# Patient Record
Sex: Female | Born: 1965 | Race: Black or African American | Hispanic: No | Smoking: Never smoker
Health system: Southern US, Community
[De-identification: ages and names within clinical notes are randomized; demographics above are authoritative.]

## PROBLEM LIST (undated history)

## (undated) DIAGNOSIS — M069 Rheumatoid arthritis, unspecified: Secondary | ICD-10-CM

## (undated) DIAGNOSIS — I890 Lymphedema, not elsewhere classified: Secondary | ICD-10-CM

---

## 2020-05-29 DIAGNOSIS — M797 Fibromyalgia: Secondary | ICD-10-CM | POA: Diagnosis not present

## 2020-06-26 DIAGNOSIS — Z23 Encounter for immunization: Secondary | ICD-10-CM | POA: Diagnosis not present

## 2020-10-16 DIAGNOSIS — M069 Rheumatoid arthritis, unspecified: Secondary | ICD-10-CM | POA: Diagnosis not present

## 2020-10-16 DIAGNOSIS — Z13220 Encounter for screening for lipoid disorders: Secondary | ICD-10-CM | POA: Diagnosis not present

## 2020-10-16 DIAGNOSIS — I10 Essential (primary) hypertension: Secondary | ICD-10-CM | POA: Diagnosis not present

## 2020-10-16 DIAGNOSIS — G43109 Migraine with aura, not intractable, without status migrainosus: Secondary | ICD-10-CM | POA: Diagnosis not present

## 2020-10-16 DIAGNOSIS — Z1159 Encounter for screening for other viral diseases: Secondary | ICD-10-CM | POA: Diagnosis not present

## 2020-11-13 DIAGNOSIS — D72829 Elevated white blood cell count, unspecified: Secondary | ICD-10-CM | POA: Diagnosis not present

## 2020-11-13 DIAGNOSIS — I10 Essential (primary) hypertension: Secondary | ICD-10-CM | POA: Diagnosis not present

## 2020-11-18 ENCOUNTER — Other Ambulatory Visit: Payer: Self-pay | Admitting: Family Medicine

## 2020-11-18 DIAGNOSIS — Z1231 Encounter for screening mammogram for malignant neoplasm of breast: Secondary | ICD-10-CM

## 2021-01-08 ENCOUNTER — Other Ambulatory Visit: Payer: Self-pay

## 2021-01-08 ENCOUNTER — Ambulatory Visit
Admission: RE | Admit: 2021-01-08 | Discharge: 2021-01-08 | Disposition: A | Discharge status: Home / Self Care | Payer: BC Managed Care – PPO | Source: Ambulatory Visit | Attending: Family Medicine | Admitting: Family Medicine

## 2021-01-08 DIAGNOSIS — Z1231 Encounter for screening mammogram for malignant neoplasm of breast: Secondary | ICD-10-CM | POA: Diagnosis not present

## 2021-02-05 DIAGNOSIS — I10 Essential (primary) hypertension: Secondary | ICD-10-CM | POA: Diagnosis not present

## 2021-02-05 DIAGNOSIS — M069 Rheumatoid arthritis, unspecified: Secondary | ICD-10-CM | POA: Diagnosis not present

## 2021-02-05 DIAGNOSIS — Z0000 Encounter for general adult medical examination without abnormal findings: Secondary | ICD-10-CM | POA: Diagnosis not present

## 2021-12-28 ENCOUNTER — Other Ambulatory Visit: Payer: Self-pay | Admitting: Family Medicine

## 2021-12-28 DIAGNOSIS — Z1231 Encounter for screening mammogram for malignant neoplasm of breast: Secondary | ICD-10-CM

## 2022-01-11 ENCOUNTER — Ambulatory Visit
Admission: RE | Admit: 2022-01-11 | Discharge: 2022-01-11 | Disposition: A | Discharge status: Home / Self Care | Payer: Managed Care, Other (non HMO) | Source: Ambulatory Visit | Attending: Family Medicine | Admitting: Family Medicine

## 2022-01-11 DIAGNOSIS — Z1231 Encounter for screening mammogram for malignant neoplasm of breast: Secondary | ICD-10-CM

## 2022-08-08 ENCOUNTER — Other Ambulatory Visit: Payer: Self-pay | Admitting: Family Medicine

## 2022-08-08 DIAGNOSIS — N644 Mastodynia: Secondary | ICD-10-CM

## 2022-08-18 ENCOUNTER — Ambulatory Visit
Admission: RE | Admit: 2022-08-18 | Discharge: 2022-08-18 | Disposition: A | Discharge status: Home / Self Care | Payer: Managed Care, Other (non HMO) | Source: Ambulatory Visit | Attending: Family Medicine | Admitting: Family Medicine

## 2022-08-18 DIAGNOSIS — N644 Mastodynia: Secondary | ICD-10-CM

## 2023-02-24 ENCOUNTER — Emergency Department (HOSPITAL_BASED_OUTPATIENT_CLINIC_OR_DEPARTMENT_OTHER): Payer: Managed Care, Other (non HMO)

## 2023-02-24 ENCOUNTER — Encounter (HOSPITAL_BASED_OUTPATIENT_CLINIC_OR_DEPARTMENT_OTHER): Payer: Self-pay | Admitting: Emergency Medicine

## 2023-02-24 ENCOUNTER — Other Ambulatory Visit: Payer: Self-pay

## 2023-02-24 ENCOUNTER — Emergency Department (HOSPITAL_BASED_OUTPATIENT_CLINIC_OR_DEPARTMENT_OTHER)
Admission: EM | Admit: 2023-02-24 | Discharge: 2023-02-24 | Disposition: A | Discharge status: Home / Self Care | Payer: Managed Care, Other (non HMO) | Attending: Emergency Medicine | Admitting: Emergency Medicine

## 2023-02-24 DIAGNOSIS — E876 Hypokalemia: Secondary | ICD-10-CM | POA: Diagnosis not present

## 2023-02-24 DIAGNOSIS — N3090 Cystitis, unspecified without hematuria: Secondary | ICD-10-CM | POA: Diagnosis not present

## 2023-02-24 DIAGNOSIS — R109 Unspecified abdominal pain: Secondary | ICD-10-CM | POA: Diagnosis present

## 2023-02-24 HISTORY — DX: Lymphedema, not elsewhere classified: I89.0

## 2023-02-24 HISTORY — DX: Rheumatoid arthritis, unspecified: M06.9

## 2023-02-24 LAB — URINALYSIS, ROUTINE W REFLEX MICROSCOPIC
Bilirubin Urine: NEGATIVE
Glucose, UA: NEGATIVE mg/dL
Ketones, ur: NEGATIVE mg/dL
Nitrite: POSITIVE — AB
Protein, ur: 30 mg/dL — AB
RBC / HPF: >50 RBC/hpf (ref 0–5)
Specific Gravity, Urine: 1.018 (ref 1.005–1.030)
WBC, UA: >50 WBC/hpf (ref 0–5)
pH: 6.5 (ref 5.0–8.0)

## 2023-02-24 LAB — CBC
HCT: 38.6 % (ref 36.0–46.0)
Hemoglobin: 13 g/dL (ref 12.0–15.0)
MCH: 27.5 pg (ref 26.0–34.0)
MCHC: 33.7 g/dL (ref 30.0–36.0)
MCV: 81.8 fL (ref 80.0–100.0)
Platelets: 306 10*3/uL (ref 150–400)
RBC: 4.72 MIL/uL (ref 3.87–5.11)
RDW: 15.2 % (ref 11.5–15.5)
WBC: 8.6 10*3/uL (ref 4.0–10.5)
nRBC: 0 % (ref 0.0–0.2)

## 2023-02-24 LAB — LIPASE, BLOOD: Lipase: 100 U/L — ABNORMAL HIGH (ref 11–51)

## 2023-02-24 LAB — COMPREHENSIVE METABOLIC PANEL
ALT: 28 U/L (ref 0–44)
AST: 23 U/L (ref 15–41)
Albumin: 4.4 g/dL (ref 3.5–5.0)
Alkaline Phosphatase: 52 U/L (ref 38–126)
Anion gap: 11 (ref 5–15)
BUN: 13 mg/dL (ref 6–20)
CO2: 28 mmol/L (ref 22–32)
Calcium: 10.1 mg/dL (ref 8.9–10.3)
Chloride: 101 mmol/L (ref 98–111)
Creatinine, Ser: 0.62 mg/dL (ref 0.44–1.00)
GFR, Estimated: >60 mL/min (ref >60)
Glucose, Bld: 130 mg/dL — ABNORMAL HIGH (ref 70–99)
Potassium: 3 mmol/L — ABNORMAL LOW (ref 3.5–5.1)
Sodium: 140 mmol/L (ref 135–145)
Total Bilirubin: 0.6 mg/dL (ref 0.3–1.2)
Total Protein: 7.4 g/dL (ref 6.5–8.1)

## 2023-02-24 LAB — PREGNANCY, URINE: Preg Test, Ur: POSITIVE — AB

## 2023-02-24 LAB — HCG, QUANTITATIVE, PREGNANCY: hCG, Beta Chain, Quant, S: 10 m[IU]/mL — ABNORMAL HIGH (ref <5)

## 2023-02-24 MED ORDER — ONDANSETRON 4 MG PO TBDP
4.0000 mg | ORAL_TABLET | Freq: Once | ORAL | Status: AC
  Administered 2023-02-24: 4 mg via ORAL
  Filled 2023-02-24: qty 1

## 2023-02-24 MED ORDER — POTASSIUM CHLORIDE 10 MEQ/100ML IV SOLN
10.0000 meq | Freq: Once | INTRAVENOUS | Status: AC
  Administered 2023-02-24: 10 meq via INTRAVENOUS
  Filled 2023-02-24: qty 100

## 2023-02-24 MED ORDER — CEPHALEXIN 500 MG PO CAPS: 500.0000 mg | ORAL_CAPSULE | Freq: Two times a day (BID) | ORAL | 0 refills | Status: AC

## 2023-02-24 MED ORDER — IOHEXOL 300 MG/ML  SOLN
100.0000 mL | Freq: Once | INTRAMUSCULAR | Status: AC | PRN
  Administered 2023-02-24: 80 mL via INTRAVENOUS

## 2023-02-24 MED ORDER — POTASSIUM CHLORIDE ER 10 MEQ PO TBCR: 10.0000 meq | EXTENDED_RELEASE_TABLET | Freq: Every day | ORAL | 0 refills | Status: AC

## 2023-02-24 MED ORDER — HYDROMORPHONE HCL 1 MG/ML IJ SOLN
1.0000 mg | Freq: Once | INTRAMUSCULAR | Status: AC
  Administered 2023-02-24: 1 mg via INTRAVENOUS
  Filled 2023-02-24: qty 1

## 2023-02-24 MED ORDER — ONDANSETRON HCL 4 MG PO TABS: 4.0000 mg | ORAL_TABLET | Freq: Four times a day (QID) | ORAL | 0 refills | Status: AC

## 2023-02-24 MED ORDER — SODIUM CHLORIDE 0.9 % IV SOLN
1.0000 g | Freq: Once | INTRAVENOUS | Status: AC
  Administered 2023-02-24: 1 g via INTRAVENOUS
  Filled 2023-02-24: qty 10

## 2023-02-24 NOTE — ED Provider Notes (Signed)
[place] EMERGENCY DEPARTMENT AT [facility] Provider Note   CSN: [id number] Arrival date & time: 02/24/23  1358     History  Chief Complaint  Patient presents with   Abdominal Pain    Jessica Parker is a 57 y.o. female w/ hx of total hysterectomy presenting to ED with lower abdominal pain, onset this morning, cramping diffuse RLQ and LLQ abdominal pain, nausea and vomiting, loose BM but no blood in stool or diarrhea.  Never had this pain before.  Severe 10/10 pain.  No other abdominal surgeries  HPI     Home Medications Prior to Admission medications   Medication Sig Start Date End Date Taking? Authorizing Provider  cephALEXin (KEFLEX) 500 MG capsule Take 1 capsule (500 mg total) by mouth 2 (two) times daily for 5 days. 02/25/23 03/02/23 Yes Shelsey Rieth, Kermit Balo, MD  ondansetron (ZOFRAN) 4 MG tablet Take 1 tablet (4 mg total) by mouth every 6 (six) hours for 12 doses. 02/24/23 02/27/23 Yes Tandra Rosado, Kermit Balo, MD  potassium chloride (KLOR-CON) 10 MEQ tablet Take 1 tablet (10 mEq total) by mouth daily for 15 days. 02/24/23 03/11/23 Yes TrifanKermit Balo, MD      Allergies    Codeine    Review of Systems   Review of Systems  Physical Exam Updated Vital Signs BP 117/67 (BP Location: Right Arm)   Pulse 80   Temp 98.7 F (37.1 C) (Oral)   Resp 16   SpO2 98%  Physical Exam Constitutional:      General: She is not in acute distress. HENT:     Head: Normocephalic and atraumatic.  Eyes:     Conjunctiva/sclera: Conjunctivae normal.     Pupils: Pupils are equal, round, and reactive to light.  Cardiovascular:     Rate and Rhythm: Normal rate and regular rhythm.  Pulmonary:     Effort: Pulmonary effort is normal. No respiratory distress.  Abdominal:     General: There is no distension.     Tenderness: There is abdominal tenderness in the right lower quadrant, suprapubic area and left lower quadrant.  Skin:    General: Skin is warm and dry.  Neurological:     General: No  focal deficit present.     Mental Status: She is alert. Mental status is at baseline.  Psychiatric:        Mood and Affect: Mood normal.        Behavior: Behavior normal.     ED Results / Procedures / Treatments   Labs (all labs ordered are listed, but only abnormal results are displayed) Labs Reviewed  LIPASE, BLOOD - Abnormal; Notable for the following components:      Result Value   Lipase 100 (*)    All other components within normal limits  COMPREHENSIVE METABOLIC PANEL - Abnormal; Notable for the following components:   Potassium 3.0 (*)    Glucose, Bld 130 (*)    All other components within normal limits  URINALYSIS, ROUTINE W REFLEX MICROSCOPIC - Abnormal; Notable for the following components:   APPearance CLOUDY (*)    Hgb urine dipstick LARGE (*)    Protein, ur 30 (*)    Nitrite POSITIVE (*)    Leukocytes,Ua LARGE (*)    Bacteria, UA MANY (*)    All other components within normal limits  PREGNANCY, URINE - Abnormal; Notable for the following components:   Preg Test, Ur POSITIVE (*)    All other components within normal limits  HCG, QUANTITATIVE,  PREGNANCY - Abnormal; Notable for the following components:   hCG, Beta Chain, Quant, S 10 (*)    All other components within normal limits  URINE CULTURE  CBC    EKG None  Radiology CT ABDOMEN PELVIS W CONTRAST  Result Date: 02/24/2023 CLINICAL DATA:  Right lower quadrant abdominal pain EXAM: CT ABDOMEN AND PELVIS WITH CONTRAST TECHNIQUE: Multidetector CT imaging of the abdomen and pelvis was performed using the standard protocol following bolus administration of intravenous contrast. RADIATION DOSE REDUCTION: This exam was performed according to the departmental dose-optimization program which includes automated exposure control, adjustment of the mA and/or kV according to patient size and/or use of iterative reconstruction technique. CONTRAST:  80mL OMNIPAQUE IOHEXOL 300 MG/ML  SOLN COMPARISON:  None Available.  FINDINGS: Lower chest: No acute abnormality.  Small hiatal hernia. Hepatobiliary: No solid liver abnormality is seen. No gallstones, gallbladder wall thickening, or biliary dilatation. Pancreas: Unremarkable. No pancreatic ductal dilatation or surrounding inflammatory changes. Spleen: Normal in size without significant abnormality. Adrenals/Urinary Tract: Adrenal glands are unremarkable. Kidneys are normal, without renal calculi, solid lesion, or hydronephrosis. Mild thickening of the urinary bladder wall with mucosal hyperenhancement (series 6, image 56). Stomach/Bowel: Stomach is within normal limits. Appendix appears normal (series 5, image 64). No evidence of bowel wall thickening, distention, or inflammatory changes. Vascular/Lymphatic: No significant vascular findings are present. No enlarged abdominal or pelvic lymph nodes. Reproductive: No mass or other significant abnormality. Other: No abdominal wall hernia or abnormality. No ascites. Musculoskeletal: No acute or significant osseous findings. IMPRESSION: 1. Mild thickening of the urinary bladder wall with mucosal hyperenhancement, consistent with cystitis. Correlate with urinalysis. 2. No other CT findings of the abdomen or pelvis to explain right lower quadrant pain. Normal appendix. 3. Small hiatal hernia. Electronically Signed   By: Jearld Lesch M.D.   On: 02/24/2023 16:14    Procedures Procedures    Medications Ordered in ED Medications  ondansetron (ZOFRAN-ODT) disintegrating tablet 4 mg (4 mg Oral Given 02/24/23 1427)  cefTRIAXone (ROCEPHIN) 1 g in sodium chloride 0.9 % 100 mL IVPB (0 g Intravenous Stopped 02/24/23 1624)  HYDROmorphone (DILAUDID) injection 1 mg (1 mg Intravenous Given 02/24/23 1533)  potassium chloride 10 mEq in 100 mL IVPB (0 mEq Intravenous Stopped 02/24/23 1644)  iohexol (OMNIPAQUE) 300 MG/ML solution 100 mL (80 mLs Intravenous Contrast Given 02/24/23 1558)    ED Course/ Medical Decision Making/ A&P                              Medical Decision Making Amount and/or Complexity of Data Reviewed Labs: ordered. Radiology: ordered.  Risk Prescription drug management.   This patient presents to the ED with concern for abdominal pain. This involves an extensive number of treatment options, and is a complaint that carries with it a high risk of complications and morbidity.  The differential diagnosis includes cystitis vs ureteral colic vs appendicitis vs colitis vs other  I ordered and personally interpreted labs.  The pertinent results include:  U preg positive - suspect false positive - pt s/p total hysterectomy.  Lipase 100.  WBC wnl.  K 3.0.  UA +leuks and nitrites & hgb.  I ordered imaging studies including CT abdomen pelvis I independently visualized and interpreted imaging which showed concern for bladder wall thickening and infection I agree with the radiologist interpretation  The patient was maintained on a cardiac monitor.  I personally viewed and interpreted the cardiac monitored  which showed an underlying rhythm of: NSR and sinus tachycardia  I ordered medication including IV Rocephin for suspected cystitis; IV potassium for hypokalemia, IV pain and nausea medication  I have reviewed the patients home medicines and have made adjustments as needed  Test Considered: s/p total hysterectomy - doubt ovarian complication - no indication for pelvic ultrasound.  Doubt PID    After the interventions noted above, I reevaluated the patient and found that they have: improved   Dispostion:  After consideration of the diagnostic results and the patients response to treatment, I feel that the patent would benefit from close outpatient follow-up.  At this time I have a low suspicion for sepsis and not believe the patient is requiring hospitalization.  I do note that she has some chronic very mildly elevated hCG levels and a falsely positive urine pregnancy test, but I do not have a suspicion for pregnancy or  ectopic.         Final Clinical Impression(s) / ED Diagnoses Final diagnoses:  Cystitis  Hypokalemia    Rx / DC Orders ED Discharge Orders          Ordered    cephALEXin (KEFLEX) 500 MG capsule  2 times daily        02/24/23 1756    potassium chloride (KLOR-CON) 10 MEQ tablet  Daily        02/24/23 1756    ondansetron (ZOFRAN) 4 MG tablet  Every 6 hours        02/24/23 1810              Terald Sleeper, MD 02/24/23 2314

## 2023-02-24 NOTE — ED Triage Notes (Signed)
Pt presents to ED Pov. Pt c/o R/L LQ abd pain. Pt c/o n/v and abd pain since this morning. Pain is cramping and 10/10

## 2023-02-24 NOTE — Discharge Instructions (Signed)
You were diagnosed with a bladder infection today in the ER.  You were given IV antibiotics and prescribed additional antibiotics to take at home.  Please follow-up with your primary care clinic for this.  Please note also that your potassium level was low today at 3.0.  You are given supplemental potassium but you need to take this at home for the next 2 weeks

## 2023-02-24 NOTE — ED Notes (Signed)
Patient transported to CT 

## 2023-02-26 LAB — URINE CULTURE: Culture: >=100000 — AB

## 2023-02-28 ENCOUNTER — Telehealth (HOSPITAL_BASED_OUTPATIENT_CLINIC_OR_DEPARTMENT_OTHER): Payer: Self-pay | Admitting: *Deleted

## 2023-02-28 NOTE — Telephone Encounter (Signed)
Post ED Visit - Positive Culture Follow-up  Culture report reviewed by antimicrobial stewardship pharmacist: [facility] [x]  Ivery Quale, [place].D. []  Celedonio Miyamoto, Pharm.D., BCPS AQ-ID []  Garvin Fila, Pharm.D., BCPS []  Georgina Pillion, Pharm.D., BCPS []  [place], [address].D., BCPS, AAHIVP []  Estella Husk, Pharm.D., BCPS, AAHIVP []  Lysle Pearl, PharmD, BCPS []  Phillips Climes, PharmD, BCPS []  Agapito Games, PharmD, BCPS []  Verlan Friends, PharmD []  Mervyn Gay, PharmD, BCPS []  Vinnie Level, PharmD  [facility] []  Len Childs, PharmD []  Greer Pickerel, PharmD []  Adalberto Cole, PharmD []  Perlie Gold, Rph []  Lonell Face) Jean Rosenthal, PharmD []  Earl Many, PharmD []  Junita Push, PharmD []  Dorna Leitz, PharmD []  Terrilee Files, PharmD []  Lynann Beaver, PharmD []  Keturah Barre, PharmD []  Loralee Pacas, PharmD []  Bernadene Person, PharmD   Positive urine culture Treated with Cephalexin, organism sensitive to the same and no further patient follow-up is required at this time.  Virl Axe [facility] 02/28/2023, 11:29 AM

## 2023-03-07 DIAGNOSIS — R1013 Epigastric pain: Secondary | ICD-10-CM | POA: Insufficient documentation

## 2023-03-07 DIAGNOSIS — I10 Essential (primary) hypertension: Secondary | ICD-10-CM | POA: Diagnosis not present

## 2023-03-07 DIAGNOSIS — R079 Chest pain, unspecified: Secondary | ICD-10-CM | POA: Diagnosis not present

## 2023-03-08 ENCOUNTER — Emergency Department (HOSPITAL_BASED_OUTPATIENT_CLINIC_OR_DEPARTMENT_OTHER): Payer: Managed Care, Other (non HMO) | Admitting: Radiology

## 2023-03-08 ENCOUNTER — Other Ambulatory Visit: Payer: Self-pay

## 2023-03-08 ENCOUNTER — Emergency Department (HOSPITAL_BASED_OUTPATIENT_CLINIC_OR_DEPARTMENT_OTHER)
Admission: EM | Admit: 2023-03-08 | Discharge: 2023-03-08 | Disposition: A | Discharge status: Home / Self Care | Payer: Managed Care, Other (non HMO) | Attending: Emergency Medicine | Admitting: Emergency Medicine

## 2023-03-08 ENCOUNTER — Encounter (HOSPITAL_BASED_OUTPATIENT_CLINIC_OR_DEPARTMENT_OTHER): Payer: Self-pay | Admitting: Emergency Medicine

## 2023-03-08 ENCOUNTER — Emergency Department (HOSPITAL_BASED_OUTPATIENT_CLINIC_OR_DEPARTMENT_OTHER): Payer: Managed Care, Other (non HMO)

## 2023-03-08 DIAGNOSIS — R079 Chest pain, unspecified: Secondary | ICD-10-CM

## 2023-03-08 DIAGNOSIS — R1012 Left upper quadrant pain: Secondary | ICD-10-CM

## 2023-03-08 LAB — CBC
HCT: 36.5 % (ref 36.0–46.0)
Hemoglobin: 12.3 g/dL (ref 12.0–15.0)
MCH: 27.6 pg (ref 26.0–34.0)
MCHC: 33.7 g/dL (ref 30.0–36.0)
MCV: 82 fL (ref 80.0–100.0)
Platelets: 302 10*3/uL (ref 150–400)
RBC: 4.45 MIL/uL (ref 3.87–5.11)
RDW: 15.1 % (ref 11.5–15.5)
WBC: 3.7 10*3/uL — ABNORMAL LOW (ref 4.0–10.5)
nRBC: 0 % (ref 0.0–0.2)

## 2023-03-08 LAB — BASIC METABOLIC PANEL
Anion gap: 8 (ref 5–15)
BUN: 11 mg/dL (ref 6–20)
CO2: 28 mmol/L (ref 22–32)
Calcium: 9.2 mg/dL (ref 8.9–10.3)
Chloride: 105 mmol/L (ref 98–111)
Creatinine, Ser: 0.77 mg/dL (ref 0.44–1.00)
GFR, Estimated: >60 mL/min (ref >60)
Glucose, Bld: 103 mg/dL — ABNORMAL HIGH (ref 70–99)
Potassium: 3.4 mmol/L — ABNORMAL LOW (ref 3.5–5.1)
Sodium: 141 mmol/L (ref 135–145)

## 2023-03-08 LAB — TROPONIN I (HIGH SENSITIVITY)
Troponin I (High Sensitivity): 3 ng/L (ref <18)
Troponin I (High Sensitivity): 3 ng/L (ref <18)

## 2023-03-08 MED ORDER — LACTATED RINGERS IV BOLUS
1000.0000 mL | Freq: Once | INTRAVENOUS | Status: AC
  Administered 2023-03-08: 1000 mL via INTRAVENOUS

## 2023-03-08 MED ORDER — PANTOPRAZOLE SODIUM 20 MG PO TBEC: 20.0000 mg | DELAYED_RELEASE_TABLET | Freq: Every day | ORAL | 0 refills | Status: AC

## 2023-03-08 MED ORDER — ONDANSETRON HCL 4 MG/2ML IJ SOLN
4.0000 mg | Freq: Four times a day (QID) | INTRAMUSCULAR | Status: DC | PRN
  Administered 2023-03-08: 4 mg via INTRAVENOUS
  Filled 2023-03-08: qty 2

## 2023-03-08 MED ORDER — IOHEXOL 350 MG/ML SOLN
100.0000 mL | Freq: Once | INTRAVENOUS | Status: AC | PRN
  Administered 2023-03-08: 100 mL via INTRAVENOUS

## 2023-03-08 MED ORDER — ONDANSETRON 4 MG PO TBDP: 4.0000 mg | ORAL_TABLET | Freq: Once | ORAL | Status: DC | PRN

## 2023-03-08 MED ORDER — HYDROMORPHONE HCL 1 MG/ML IJ SOLN
1.0000 mg | Freq: Once | INTRAMUSCULAR | Status: AC
  Administered 2023-03-08: 1 mg via INTRAVENOUS
  Filled 2023-03-08: qty 1

## 2023-03-08 NOTE — ED Provider Notes (Signed)
[place] EMERGENCY DEPARTMENT AT [facility] Provider Note   CSN: [id number] Arrival date & time: 03/07/23  2354     History  Chief Complaint  Patient presents with   Chest Pain    Jessica ESHBACH is a 57 y.o. female.  57 year old female with history of RA, hypertension who presents the ER today with chest and abdominal pain.  Patient states that while she was at work doing her normal activities she started having some sharp pain retrosternally.  Drink some water walk around it seems getting better.  Felt like it was hard to breathe at that time but not necessarily short of breath.  Throughout the next few hours of progressively worsened and became in the epigastric area and then wrapping around the left side towards her back.  No fevers did have some nausea with it.  No trauma.  No rashes.   Chest Pain      Home Medications Prior to Admission medications   Medication Sig Start Date End Date Taking? Authorizing Provider  pantoprazole (PROTONIX) 20 MG tablet Take 1 tablet (20 mg total) by mouth daily. 03/08/23  Yes Jerrie Schussler, Barbara Cower, MD  potassium chloride (KLOR-CON) 10 MEQ tablet Take 1 tablet (10 mEq total) by mouth daily for 15 days. 02/24/23 03/11/23  Terald Sleeper, MD      Allergies    Codeine    Review of Systems   Review of Systems  Cardiovascular:  Positive for chest pain.    Physical Exam Updated Vital Signs BP (!) 125/92   Pulse 67   Temp 98 F (36.7 C) (Oral)   Resp 14   Ht 5\' 4"  (1.626 m)   Wt 89.4 kg   SpO2 94%   BMI 33.81 kg/m  Physical Exam Vitals and nursing note reviewed.  Constitutional:      Appearance: She is well-developed.  HENT:     Head: Normocephalic and atraumatic.  Cardiovascular:     Rate and Rhythm: Normal rate and regular rhythm.  Pulmonary:     Effort: No respiratory distress.     Breath sounds: No stridor.  Abdominal:     General: There is no distension.     Tenderness: There is abdominal tenderness in the  epigastric area and left upper quadrant.  Musculoskeletal:     Cervical back: Normal range of motion.  Neurological:     Mental Status: She is alert.     ED Results / Procedures / Treatments   Labs (all labs ordered are listed, but only abnormal results are displayed) Labs Reviewed  BASIC METABOLIC PANEL - Abnormal; Notable for the following components:      Result Value   Potassium 3.4 (*)    Glucose, Bld 103 (*)    All other components within normal limits  CBC - Abnormal; Notable for the following components:   WBC 3.7 (*)    All other components within normal limits  TROPONIN I (HIGH SENSITIVITY)  TROPONIN I (HIGH SENSITIVITY)    EKG EKG Interpretation Date/Time:  Wednesday March 08 2023 00:01:14 EDT Ventricular Rate:  74 PR Interval:  150 QRS Duration:  86 QT Interval:  382 QTC Calculation: 424 R Axis:   -39  Text Interpretation: Sinus rhythm with Premature atrial complexes with Abberant conduction Possible Left atrial enlargement Left axis deviation Left ventricular hypertrophy ( R in aVL , Cornell product ) Nonspecific T wave abnormality Abnormal ECG No previous ECGs available Confirmed by Marily Memos [phone]) on 03/08/2023 12:09:08 AM  Radiology CT Angio Chest/Abd/Pel for Dissection W and/or Wo Contrast  Result Date: 03/08/2023 CLINICAL DATA:  Left-sided chest pain EXAM: CT ANGIOGRAPHY CHEST, ABDOMEN AND PELVIS TECHNIQUE: Non-contrast CT of the chest was initially obtained. Multidetector CT imaging through the chest, abdomen and pelvis was performed using the standard protocol during bolus administration of intravenous contrast. Multiplanar reconstructed images and MIPs were obtained and reviewed to evaluate the vascular anatomy. RADIATION DOSE REDUCTION: This exam was performed according to the departmental dose-optimization program which includes automated exposure control, adjustment of the mA and/or kV according to patient size and/or use of iterative reconstruction  technique. CONTRAST:  OMNIPAQUE IOHEXOL 350 MG/ML SOLN COMPARISON:  02/24/2023 FINDINGS: CTA CHEST FINDINGS Cardiovascular: --Heart: The heart size is normal.  There is nopericardial effusion. --Aorta: The course and caliber of the thoracic aorta are normal. There is mild aortic atherosclerotic calcification. Precontrast images show no aortic intramural hematoma. There is no blood pool, dissection or penetrating ulcer demonstrated on arterial phase postcontrast imaging. There is a conventional 3 vessel aortic arch branching pattern. The proximal arch vessels are widely patent. --Pulmonary Arteries: Contrast timing is optimized for preferential opacification of the aorta. Within that limitation, normal central pulmonary arteries. Mediastinum/Nodes: No mediastinal, hilar or axillary lymphadenopathy. The visualized thyroid and thoracic esophageal course are unremarkable. Lungs/Pleura: No pulmonary nodules or masses. No pleural effusion or pneumothorax. No focal airspace consolidation. No focal pleural abnormality. Musculoskeletal: No chest wall abnormality. No acute osseous findings. Review of the MIP images confirms the above findings. CTA ABDOMEN AND PELVIS FINDINGS VASCULAR Aorta: Normal caliber aorta without aneurysm, dissection, vasculitis or hemodynamically significant stenosis. There is no aortic atherosclerosis. Celiac: No aneurysm, dissection or hemodynamically significant stenosis. Normal branching pattern. SMA: Widely patent without dissection or stenosis. Renals: Single renal arteries bilaterally. No aneurysm, dissection, stenosis or evidence of fibromuscular dysplasia. IMA: Patent without abnormality. Inflow: No aneurysm, stenosis or dissection. Veins: Normal course and caliber of the major veins. Assessment is otherwise limited by the arterial dominant contrast phase. Review of the MIP images confirms the above findings. NON-VASCULAR Hepatobiliary: Normal hepatic contours and density. No visible  biliary dilatation. Normal gallbladder. Pancreas: Normal contours without ductal dilatation. No peripancreatic fluid collection. Spleen: Normal arterial phase splenic enhancement pattern. Adrenals/Urinary Tract: --Adrenal glands: Normal. --Right kidney/ureter: No hydronephrosis or perinephric stranding. No nephrolithiasis. No obstructing ureteral stones. --Left kidney/ureter: No hydronephrosis or perinephric stranding. No nephrolithiasis. No obstructing ureteral stones. --Urinary bladder: Unremarkable. Stomach/Bowel: --Stomach/Duodenum: No hiatal hernia or other gastric abnormality. Normal duodenal course and caliber. --Small bowel: No dilatation or inflammation. --Colon: No focal abnormality. --Appendix: Normal. Lymphatic:  No abdominal or pelvic lymphadenopathy. Reproductive: Status post hysterectomy. No adnexal mass. Musculoskeletal. No bony spinal canal stenosis or focal osseous abnormality. Other: None. Review of the MIP images confirms the above findings. IMPRESSION: 1. No acute aortic syndrome. 2. No acute abnormality of the chest, abdomen or pelvis. 3. Mild aortic atherosclerotic calcification. Aortic atherosclerosis (ICD10-I70.0). Electronically Signed   By: Deatra Robinson M.D.   On: 03/08/2023 03:57   DG Chest 2 View  Result Date: 03/08/2023 CLINICAL DATA:  Left-sided chest pain radiating to the back EXAM: CHEST - 2 VIEW COMPARISON:  None Available. FINDINGS: Normal cardiomediastinal silhouette. Aortic atherosclerotic calcification. Bibasilar atelectasis/scarring. Otherwise no focal consolidation, pleural effusion, or pneumothorax. No displaced rib fractures. IMPRESSION: No active cardiopulmonary disease. Electronically Signed   By: Minerva Fester M.D.   On: 03/08/2023 00:34    Procedures Procedures    Medications Ordered in ED Medications  ondansetron (ZOFRAN-ODT) disintegrating tablet 4 mg (has no administration in time range)  ondansetron (ZOFRAN) injection 4 mg (4 mg Intravenous Given  03/08/23 0303)  lactated ringers bolus 1,000 mL (1,000 mLs Intravenous New Bag/Given 03/08/23 0253)  HYDROmorphone (DILAUDID) injection 1 mg (1 mg Intravenous Given 03/08/23 0305)  iohexol (OMNIPAQUE) 350 MG/ML injection 100 mL (100 mLs Intravenous Contrast Given 03/08/23 0320)    ED Course/ Medical Decision Making/ A&P                             Medical Decision Making Amount and/or Complexity of Data Reviewed Labs: ordered. Radiology: ordered.  Risk Prescription drug management.   57 year old female with nonspecific chest pain and upper abdominal pain worse on the left wraparound to the back.  EKG is normal.  Troponins are within normal limits.  Slightly hypokalemic but this is ongoing issue for her being addressed by her primary physicians.  Pain improved here with medications.  At the end of her workup to include a chest x-ray and CT dissection study there is no obvious causes for symptoms.  Consider possible dissection, PE, ACS, peptic ulcer disease, pancreatitis, splenomegaly however based on her CT scan and labs/EKG and response to medications I think is less likely.  She is pain-free at time of discharge.  Will refer to cardiology as needed.  Otherwise follow-up with PCP.  Consider peptic ulcer disease/gastritis as a possible cause will initiate Protonix until she can follow-up with her PCP.  They will also continue to follow her potassium levels.    Final Clinical Impression(s) / ED Diagnoses Final diagnoses:  Nonspecific chest pain  Left upper quadrant abdominal pain    Rx / DC Orders ED Discharge Orders          Ordered    pantoprazole (PROTONIX) 20 MG tablet  Daily        03/08/23 0519    Ambulatory referral to Cardiology        03/08/23 0519              Edwardo Wojnarowski, Barbara Cower, MD 03/08/23 [phone]

## 2023-03-08 NOTE — ED Triage Notes (Signed)
Pt arrives via POV c/o left sided chest pain that radiates into the back that started earlier today while at work. Pain constants and described as pressure. Associated with nausea and SOB.

## 2023-05-01 ENCOUNTER — Other Ambulatory Visit: Payer: Self-pay | Admitting: Family Medicine

## 2023-05-01 DIAGNOSIS — Z1231 Encounter for screening mammogram for malignant neoplasm of breast: Secondary | ICD-10-CM

## 2023-05-08 ENCOUNTER — Ambulatory Visit: Payer: Managed Care, Other (non HMO) | Admitting: Internal Medicine

## 2023-05-18 ENCOUNTER — Ambulatory Visit
Admission: RE | Admit: 2023-05-18 | Discharge: 2023-05-18 | Disposition: A | Discharge status: Home / Self Care | Payer: Managed Care, Other (non HMO) | Source: Ambulatory Visit | Attending: Family Medicine | Admitting: Family Medicine

## 2023-05-18 DIAGNOSIS — Z1231 Encounter for screening mammogram for malignant neoplasm of breast: Secondary | ICD-10-CM

## 2023-06-08 IMAGING — MG MM DIGITAL SCREENING BILAT W/ TOMO AND CAD
8 series · 8 of 24 positions shown · non-contrast
Comparison: Previous exam(s).

CLINICAL DATA: Screening.

EXAM:
DIGITAL SCREENING BILATERAL MAMMOGRAM WITH TOMOSYNTHESIS AND CAD
TECHNIQUE: Bilateral screening digital craniocaudal and mediolateral oblique
mammograms were obtained. Bilateral screening digital breast
tomosynthesis was performed. The images were evaluated with
computer-aided detection.

[R CC synth-2D]
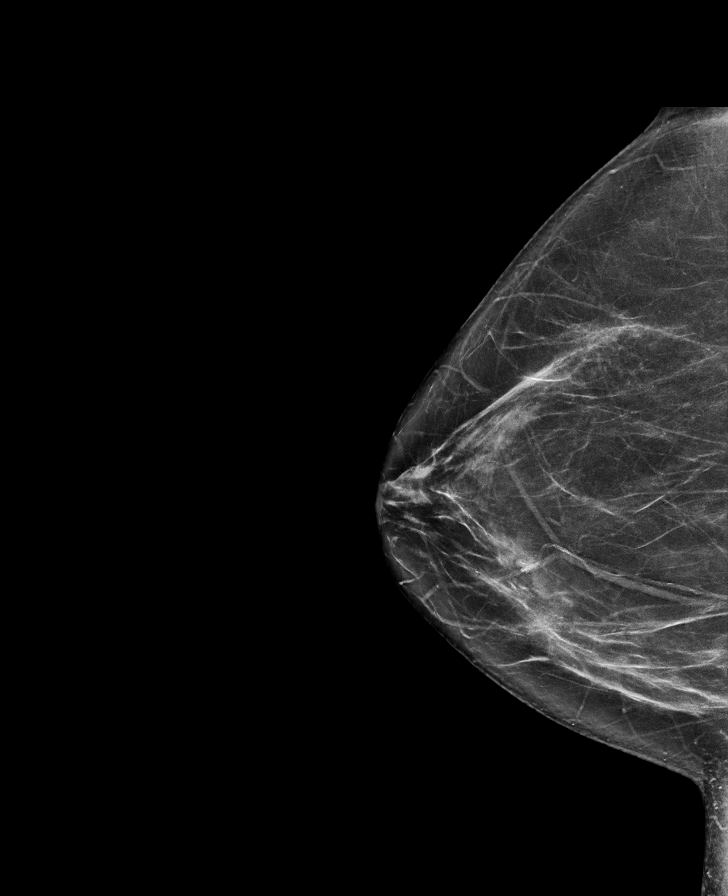

[L MLO synth-2D]
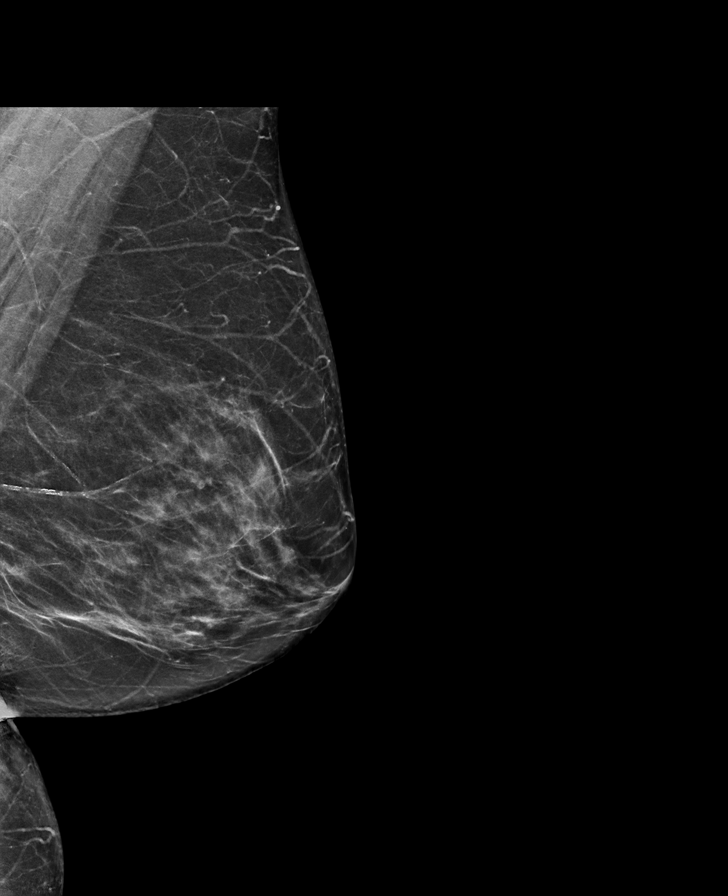

[R MLO synth-2D]
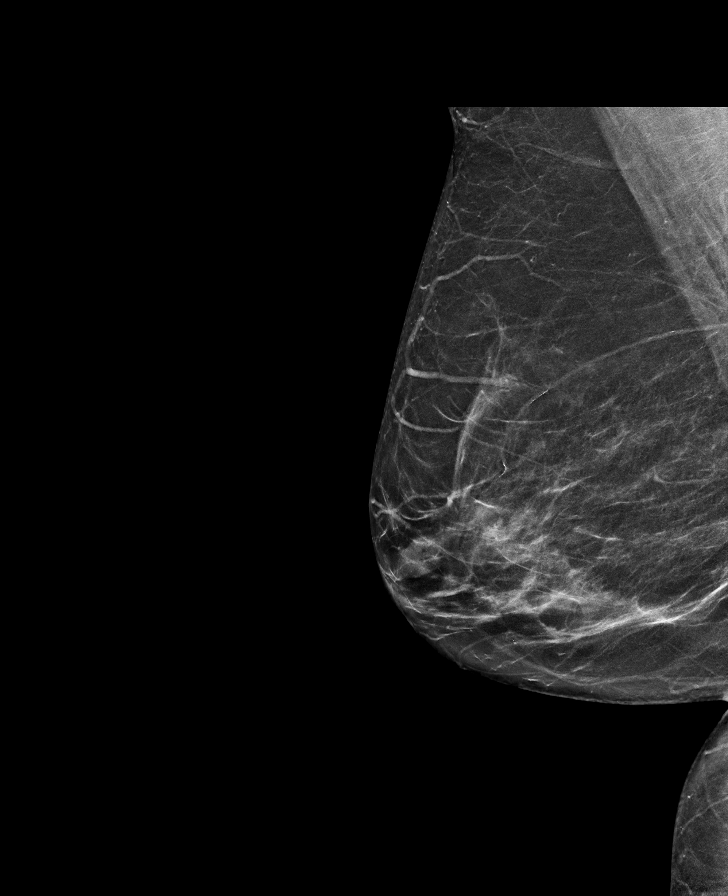

[L CC synth-2D]
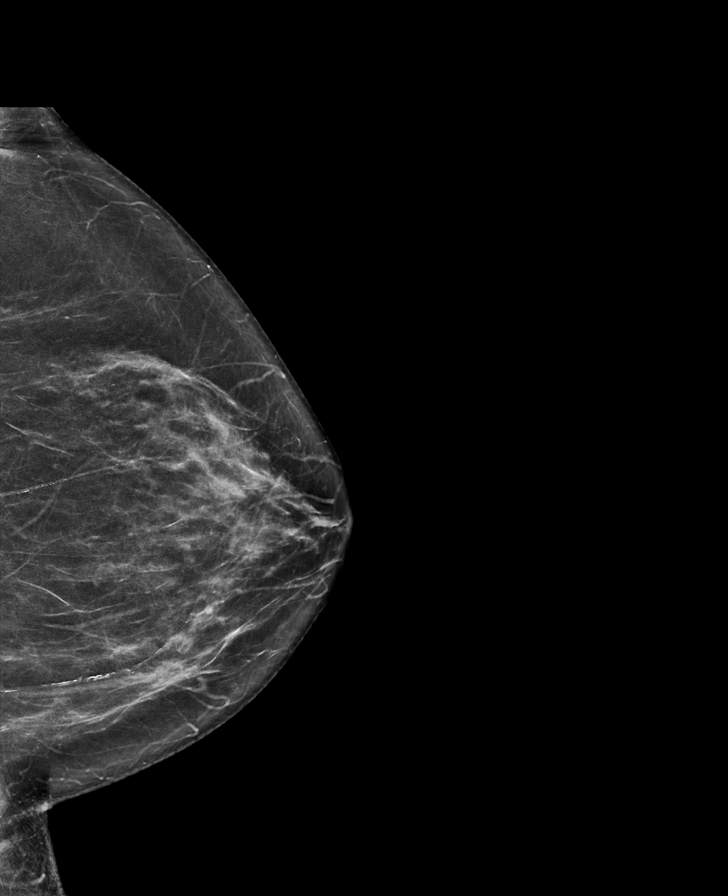

[L MLO tomo · tomo slice 36/71.0]
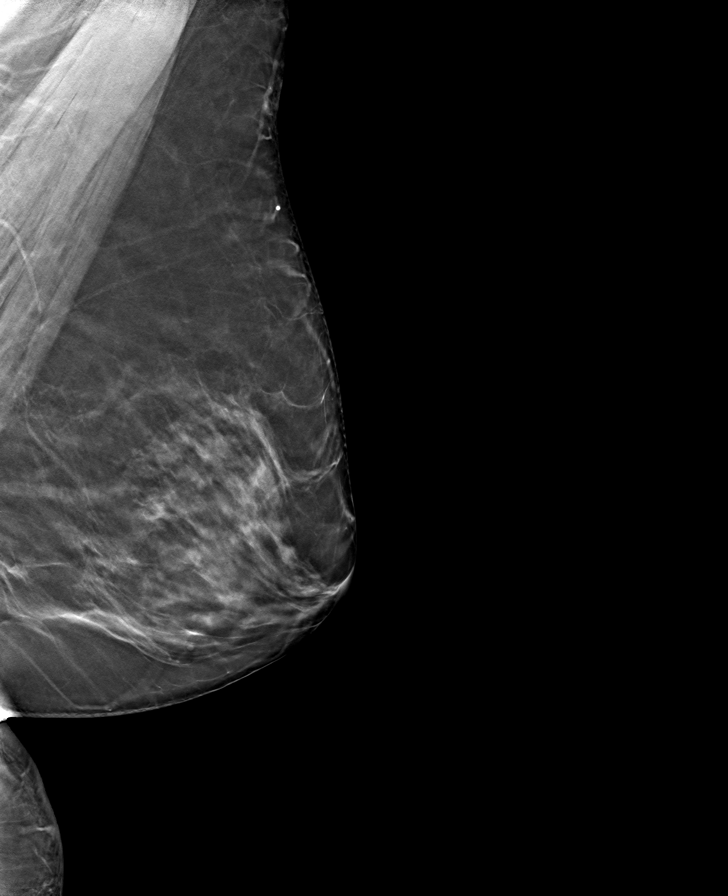

[L CC tomo · tomo slice 35/68.0]
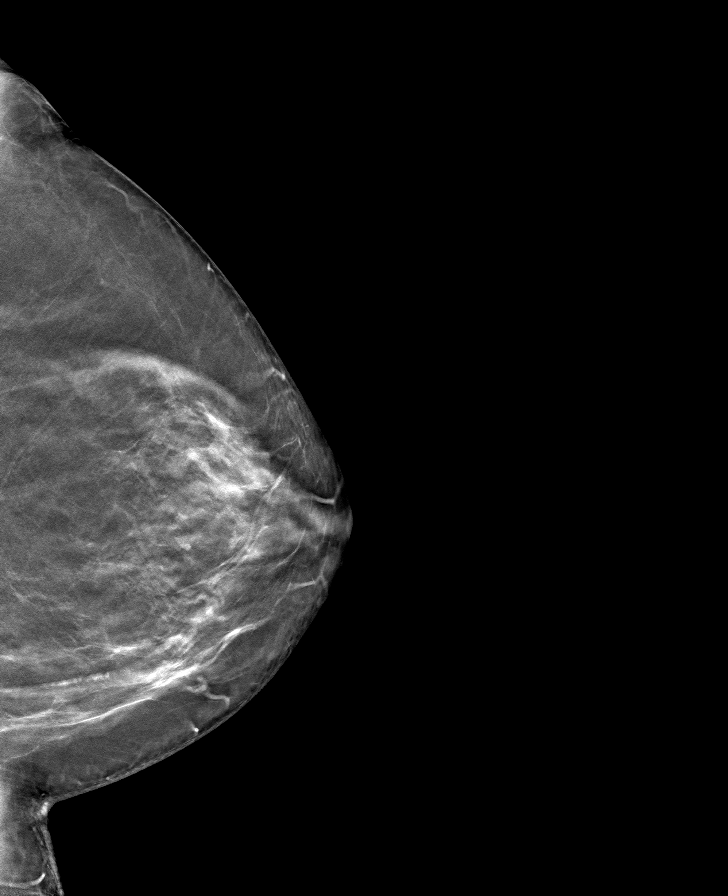

[R CC tomo · tomo slice 35/70.0]
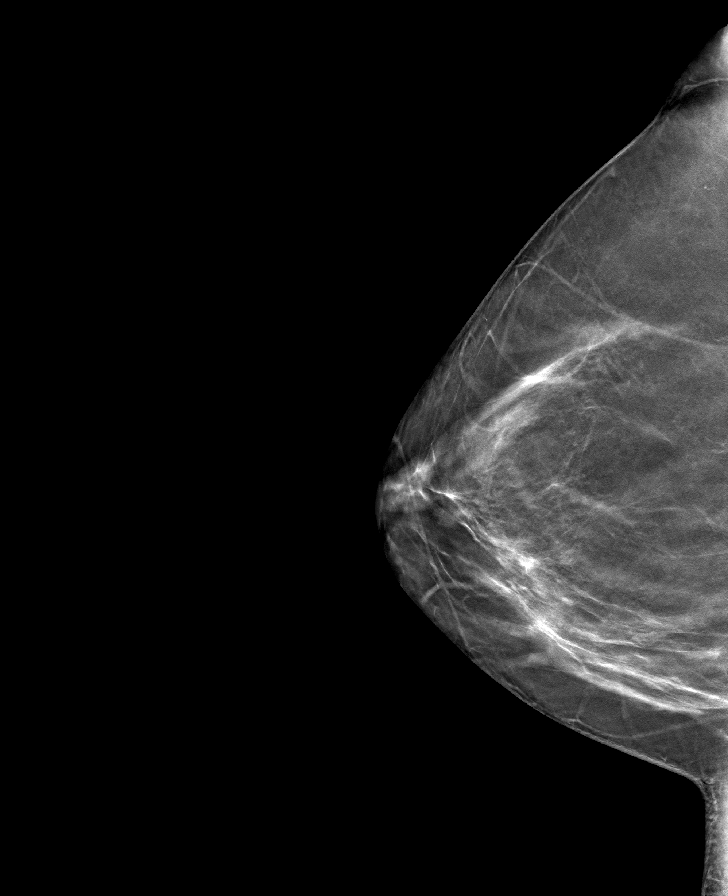

[R MLO tomo · tomo slice 35/69.0]
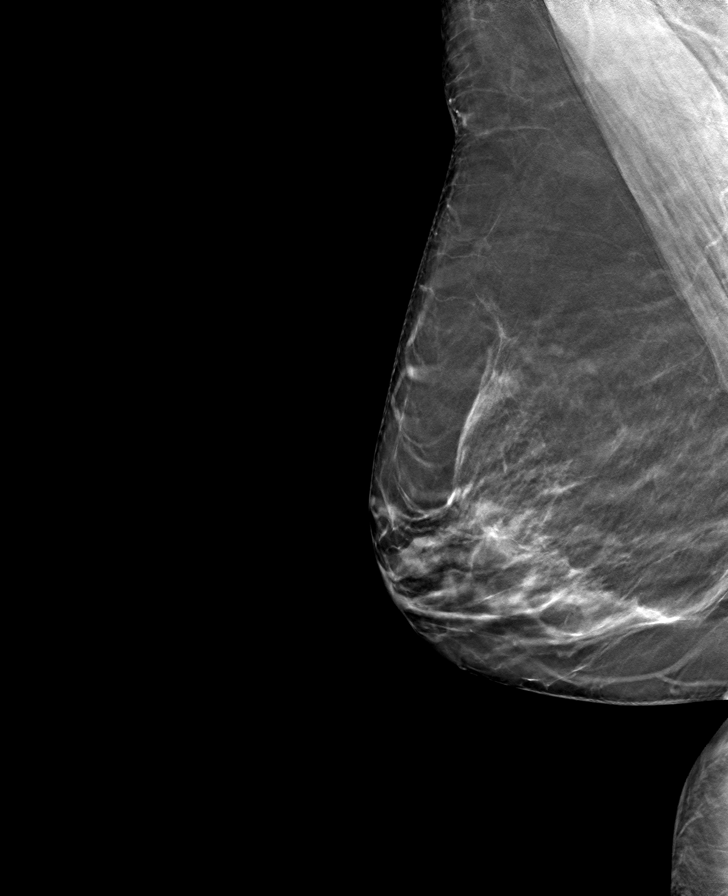

[8 of 24 positions shown; findings below may reference images not displayed]

ACR Breast Density Category b: There are scattered areas of
fibroglandular density.
FINDINGS: There are no findings suspicious for malignancy.
IMPRESSION: No mammographic evidence of malignancy. A result letter of this
screening mammogram will be mailed directly to the patient.

RECOMMENDATION:
Screening mammogram in one year. (Code:51-O-LD2)

BI-RADS CATEGORY  1: Negative.

## 2024-03-16 ENCOUNTER — Encounter (HOSPITAL_BASED_OUTPATIENT_CLINIC_OR_DEPARTMENT_OTHER): Payer: Self-pay | Admitting: Emergency Medicine

## 2024-03-16 ENCOUNTER — Emergency Department (HOSPITAL_BASED_OUTPATIENT_CLINIC_OR_DEPARTMENT_OTHER): Admission: EM | Admit: 2024-03-16 | Discharge: 2024-03-16 | Disposition: A | Discharge status: Home / Self Care

## 2024-03-16 ENCOUNTER — Other Ambulatory Visit: Payer: Self-pay

## 2024-03-16 DIAGNOSIS — G43801 Other migraine, not intractable, with status migrainosus: Secondary | ICD-10-CM | POA: Diagnosis not present

## 2024-03-16 DIAGNOSIS — I10 Essential (primary) hypertension: Secondary | ICD-10-CM | POA: Insufficient documentation

## 2024-03-16 DIAGNOSIS — R519 Headache, unspecified: Secondary | ICD-10-CM | POA: Diagnosis present

## 2024-03-16 DIAGNOSIS — R030 Elevated blood-pressure reading, without diagnosis of hypertension: Secondary | ICD-10-CM

## 2024-03-16 MED ORDER — DIPHENHYDRAMINE HCL 50 MG/ML IJ SOLN
12.5000 mg | Freq: Once | INTRAMUSCULAR | Status: AC
  Administered 2024-03-16: 12.5 mg via INTRAVENOUS
  Filled 2024-03-16: qty 1

## 2024-03-16 MED ORDER — PROCHLORPERAZINE MALEATE 10 MG PO TABS: 5.0000 mg | ORAL_TABLET | Freq: Two times a day (BID) | ORAL | 0 refills | Status: AC | PRN

## 2024-03-16 MED ORDER — SODIUM CHLORIDE 0.9 % IV BOLUS
1000.0000 mL | Freq: Once | INTRAVENOUS | Status: AC
  Administered 2024-03-16: 1000 mL via INTRAVENOUS

## 2024-03-16 MED ORDER — KETOROLAC TROMETHAMINE 30 MG/ML IJ SOLN
15.0000 mg | Freq: Once | INTRAMUSCULAR | Status: AC
  Administered 2024-03-16: 15 mg via INTRAVENOUS
  Filled 2024-03-16: qty 1

## 2024-03-16 MED ORDER — NAPROXEN SODIUM 550 MG PO TABS: 275.0000 mg | ORAL_TABLET | Freq: Two times a day (BID) | ORAL | 0 refills | Status: AC

## 2024-03-16 MED ORDER — PROCHLORPERAZINE EDISYLATE 10 MG/2ML IJ SOLN
5.0000 mg | Freq: Once | INTRAMUSCULAR | Status: AC
  Administered 2024-03-16: 5 mg via INTRAVENOUS
  Filled 2024-03-16: qty 2

## 2024-03-16 NOTE — ED Notes (Signed)
 ED Provider at bedside.

## 2024-03-16 NOTE — ED Triage Notes (Signed)
 Pt arrived from home with c/o headache, weakness, nausea since Tuesday.  Elevated BP at home, 152/115 this am.

## 2024-03-16 NOTE — ED Provider Notes (Signed)
 [facility] EMERGENCY DEPARTMENT AT [facility] Provider Note   CSN: [id number] Arrival date & time: 03/16/24  1217     Patient presents with: Hypertension, Headache, and Weakness   Jessica Parker is a 58 y.o. female with past medical history of migraine headaches who presents the emergency department with high blood pressure and right sided headache.  Patient reports that her blood pressure is high.  She takes her losartan daily as directed and has not had any medication changes.  She also has a right sided throbbing headache with blurry vision and nausea.  This has been going on for the past 5 days.  She took Imitrex twice without relief of her symptoms.  Symptoms of nausea and blurry vision are common with headache for the patient.  She denies fever or neck stiffness cough urinary symptoms or other systemic symptoms.  Took ibuprofen as well and did not have relief of her symptoms Denies fever or neck stiffness, denies thunderclap onset   Hypertension Associated symptoms include headaches.  Headache Associated symptoms: weakness   Weakness Associated symptoms: headaches        Prior to Admission medications   Medication Sig Start Date End Date Taking? Authorizing Provider  pantoprazole  (PROTONIX ) 20 MG tablet Take 1 tablet (20 mg total) by mouth daily. 03/08/23   Mesner, Selinda, MD  potassium chloride  (KLOR-CON ) 10 MEQ tablet Take 1 tablet (10 mEq total) by mouth daily for 15 days. 02/24/23 03/11/23  Cottie Donnice PARAS, MD    Allergies: Codeine    Review of Systems  Neurological:  Positive for weakness and headaches.    Updated Vital Signs BP (!) 140/97   Pulse 64   Temp 98.3 F (36.8 C) (Oral)   Resp 18   SpO2 100%   Physical Exam Vitals and nursing note reviewed.  Constitutional:      General: She is not in acute distress.    Appearance: She is well-developed. She is not diaphoretic.  HENT:     Head: Normocephalic and atraumatic.     Nose: Nose normal.      Mouth/Throat:     Mouth: Mucous membranes are moist.  Eyes:     General: No scleral icterus.    Conjunctiva/sclera: Conjunctivae normal.     Pupils: Pupils are equal, round, and reactive to light.     Comments: No horizontal, vertical or rotational nystagmus  Neck:     Comments: Full active and passive ROM without pain No midline or paraspinal tenderness No nuchal rigidity or meningeal signs Cardiovascular:     Rate and Rhythm: Normal rate and regular rhythm.  Pulmonary:     Effort: Pulmonary effort is normal. No respiratory distress.     Breath sounds: No wheezing or rales.  Abdominal:     General: There is no distension.     Palpations: Abdomen is soft.     Tenderness: There is no abdominal tenderness. There is no guarding or rebound.  Musculoskeletal:        General: Normal range of motion.     Cervical back: Normal range of motion and neck supple.  Lymphadenopathy:     Cervical: No cervical adenopathy.  Skin:    General: Skin is warm and dry.     Findings: No rash.  Neurological:     Mental Status: She is alert and oriented to person, place, and time.     Cranial Nerves: No cranial nerve deficit.     Motor: No abnormal muscle tone.  Coordination: Coordination normal.     Comments: Speech is clear and goal oriented, follows commands Major Cranial nerves without deficit, no facial droop Normal strength in upper and lower extremities bilaterally including dorsiflexion and plantar flexion, strong and equal grip strength Sensation normal to light touch Moves extremities without ataxia, coordination intact Normal finger to nose and rapid alternating movements Neg romberg, no pronator drift Normal gait Normal heel-shin and balance   Psychiatric:        Behavior: Behavior normal.        Thought Content: Thought content normal.        Judgment: Judgment normal.     (all labs ordered are listed, but only abnormal results are displayed) Labs Reviewed - No data to  display  EKG: None  Radiology: No results found.   Procedures   Medications Ordered in the ED - No data to display                                  Medical Decision Making Jessica Parker presents with headache Given the large differential diagnosis for Jessica Parker, the decision making in this case is of high complexity.  After evaluating all of the data points in this case, the presentation of Jessica Parker is NOT consistent with skull fracture, meningitis/encephalitis, SAH/sentinel bleed, Intracranial Hemorrhage (ICH) (subdural/epidural), acute obstructive hydrocephalus, space occupying lesions, CVA, CO Poisoning, Basilar/vertebral artery dissection, preeclampsia, cerebral venous thrombosis, hypertensive emergency, temporal Arteritis, Idiopathic Intracranial Hypertension (pseudotumor cerebri).  Strict return and follow-up precautions have been given by me personally or by detailed written instructions verbalized by nursing staff using the teach back method to patient/family/caregiver.  Data Reviewed/Counseling: I have reviewed the patient's vital signs, nursing notes, and other relevant tests/information. I had a detailed discussion regarding the historical points, exam findings, and any diagnostic results supporting the discharge diagnosis. I also discussed the need for outpatient follow-up and the need to return to the ED if symptoms worsen or if there are any questions or concerns that arise at hom    Risk Prescription drug management.        Final diagnoses:  None    ED Discharge Orders     None          Arloa Chroman, PA-C 03/25/24 1017    Ula Prentice JONELLE, MD 03/26/24 [phone]

## 2024-03-16 NOTE — Discharge Instructions (Addendum)
 You are diagnosed with status migrainosus today. Discharging you with naproxen  sodium 550 mg tablet and Compazine  10 mg tablet. At onset of headache I want you to take 1/2 tablet of naproxen  (275 mg) and 1/2 tablet of Compazine  (5 mg).  Take this with a little bit of food like a cracker. If your headache is not resolved within an hour you may repeat the same dose.  You are having a headache. No specific cause was found today for your headache. It may have been a migraine or other cause of headache. Stress, anxiety, fatigue, and depression are common triggers for headaches. Your headache today does not appear to be life-threatening or require hospitalization, but often the exact cause of headaches is not determined in the emergency department. Therefore, follow-up with your doctor is very important to find out what may have caused your headache, and whether or not you need any further diagnostic testing or treatment. Sometimes headaches can appear benign (not harmful), but then more serious symptoms can develop which should prompt an immediate re-evaluation by your doctor or the emergency department. SEEK MEDICAL ATTENTION IF: You develop possible problems with medications prescribed.  The medications don't resolve your headache, if it recurs , or if you have multiple episodes of vomiting or can't take fluids. You have a change from the usual headache. RETURN IMMEDIATELY IF you develop a sudden, severe headache or confusion, become poorly responsive or faint, develop a fever above 100.22F or problem breathing, have a change in speech, vision, swallowing, or understanding, or develop new weakness, numbness, tingling, incoordination, or have a seizure.  Follow-up with your primary care doctor about elevated blood pressure readings. Help right away for any new or worsening symptoms

## 2024-04-16 ENCOUNTER — Other Ambulatory Visit (HOSPITAL_BASED_OUTPATIENT_CLINIC_OR_DEPARTMENT_OTHER): Payer: Self-pay | Admitting: Rheumatology

## 2024-04-16 DIAGNOSIS — M0579 Rheumatoid arthritis with rheumatoid factor of multiple sites without organ or systems involvement: Secondary | ICD-10-CM

## 2024-05-28 ENCOUNTER — Other Ambulatory Visit: Payer: Self-pay | Admitting: Family Medicine

## 2024-05-28 DIAGNOSIS — Z1231 Encounter for screening mammogram for malignant neoplasm of breast: Secondary | ICD-10-CM

## 2024-06-06 ENCOUNTER — Ambulatory Visit
Admission: RE | Admit: 2024-06-06 | Discharge: 2024-06-06 | Disposition: A | Discharge status: Home / Self Care | Source: Ambulatory Visit | Attending: Family Medicine | Admitting: Family Medicine

## 2024-06-06 DIAGNOSIS — Z1231 Encounter for screening mammogram for malignant neoplasm of breast: Secondary | ICD-10-CM

## 2024-08-02 ENCOUNTER — Ambulatory Visit (HOSPITAL_BASED_OUTPATIENT_CLINIC_OR_DEPARTMENT_OTHER)
Admission: RE | Admit: 2024-08-02 | Discharge: 2024-08-02 | Disposition: A | Source: Ambulatory Visit | Attending: Rheumatology

## 2024-08-02 DIAGNOSIS — M0579 Rheumatoid arthritis with rheumatoid factor of multiple sites without organ or systems involvement: Secondary | ICD-10-CM
